# Patient Record
Sex: Female | Born: 1960 | Race: Asian | Hispanic: No | Marital: Married
Health system: Southern US, Community
[De-identification: ages and names within clinical notes are randomized; demographics above are authoritative.]

## PROBLEM LIST (undated history)

## (undated) DIAGNOSIS — A1889 Tuberculosis of other sites: Secondary | ICD-10-CM

## (undated) DIAGNOSIS — R509 Fever, unspecified: Secondary | ICD-10-CM

## (undated) DIAGNOSIS — A182 Tuberculous peripheral lymphadenopathy: Secondary | ICD-10-CM

## (undated) HISTORY — DX: Tuberculosis of other sites: A18.89

## (undated) HISTORY — DX: Tuberculous peripheral lymphadenopathy: A18.2

## (undated) HISTORY — DX: Fever, unspecified: R50.9

---

## 2012-07-15 DIAGNOSIS — N289 Disorder of kidney and ureter, unspecified: Secondary | ICD-10-CM | POA: Insufficient documentation

## 2012-07-15 DIAGNOSIS — J45909 Unspecified asthma, uncomplicated: Secondary | ICD-10-CM | POA: Insufficient documentation

## 2012-08-05 ENCOUNTER — Other Ambulatory Visit: Payer: Self-pay | Admitting: Family Medicine

## 2012-08-05 DIAGNOSIS — Z1231 Encounter for screening mammogram for malignant neoplasm of breast: Secondary | ICD-10-CM

## 2012-08-19 ENCOUNTER — Ambulatory Visit
Admission: RE | Admit: 2012-08-19 | Discharge: 2012-08-19 | Disposition: A | Discharge status: Home / Self Care | Payer: Commercial Indemnity | Source: Ambulatory Visit | Attending: Family Medicine | Admitting: Family Medicine

## 2012-08-19 DIAGNOSIS — Z1231 Encounter for screening mammogram for malignant neoplasm of breast: Secondary | ICD-10-CM

## 2013-08-05 ENCOUNTER — Other Ambulatory Visit: Payer: Self-pay

## 2013-08-23 ENCOUNTER — Other Ambulatory Visit: Payer: Self-pay

## 2013-08-23 DIAGNOSIS — Z1231 Encounter for screening mammogram for malignant neoplasm of breast: Secondary | ICD-10-CM

## 2013-09-13 ENCOUNTER — Ambulatory Visit
Admission: RE | Admit: 2013-09-13 | Discharge: 2013-09-13 | Disposition: A | Discharge status: Home / Self Care | Payer: Managed Care, Other (non HMO) | Source: Ambulatory Visit

## 2013-09-13 DIAGNOSIS — Z1231 Encounter for screening mammogram for malignant neoplasm of breast: Secondary | ICD-10-CM

## 2013-09-19 ENCOUNTER — Other Ambulatory Visit: Payer: Self-pay | Admitting: Family Medicine

## 2013-09-19 DIAGNOSIS — R928 Other abnormal and inconclusive findings on diagnostic imaging of breast: Secondary | ICD-10-CM

## 2013-10-10 ENCOUNTER — Ambulatory Visit
Admission: RE | Admit: 2013-10-10 | Discharge: 2013-10-10 | Disposition: A | Discharge status: Home / Self Care | Payer: Managed Care, Other (non HMO) | Source: Ambulatory Visit | Attending: Family Medicine | Admitting: Family Medicine

## 2013-10-10 ENCOUNTER — Other Ambulatory Visit: Payer: Self-pay | Admitting: Family Medicine

## 2013-10-10 DIAGNOSIS — R928 Other abnormal and inconclusive findings on diagnostic imaging of breast: Secondary | ICD-10-CM

## 2013-10-10 DIAGNOSIS — R921 Mammographic calcification found on diagnostic imaging of breast: Secondary | ICD-10-CM

## 2013-10-17 ENCOUNTER — Ambulatory Visit
Admission: RE | Admit: 2013-10-17 | Discharge: 2013-10-17 | Disposition: A | Discharge status: Home / Self Care | Payer: Managed Care, Other (non HMO) | Source: Ambulatory Visit | Attending: Family Medicine | Admitting: Family Medicine

## 2013-10-17 DIAGNOSIS — R921 Mammographic calcification found on diagnostic imaging of breast: Secondary | ICD-10-CM

## 2013-10-31 ENCOUNTER — Other Ambulatory Visit (HOSPITAL_COMMUNITY): Payer: Self-pay | Admitting: Family Medicine

## 2013-10-31 DIAGNOSIS — R51 Headache: Principal | ICD-10-CM

## 2013-10-31 DIAGNOSIS — R519 Headache, unspecified: Secondary | ICD-10-CM

## 2014-05-15 IMAGING — MG MM DIGITAL DIAGNOSTIC UNILAT*L*
2 series · 2 of 2 positions shown · non-contrast
Comparison: 09/13/2013, 08/19/2012.

CLINICAL DATA: Recall from screening mammography

EXAM:
DIGITAL DIAGNOSTIC  left breast MAMMOGRAM

[L CC]
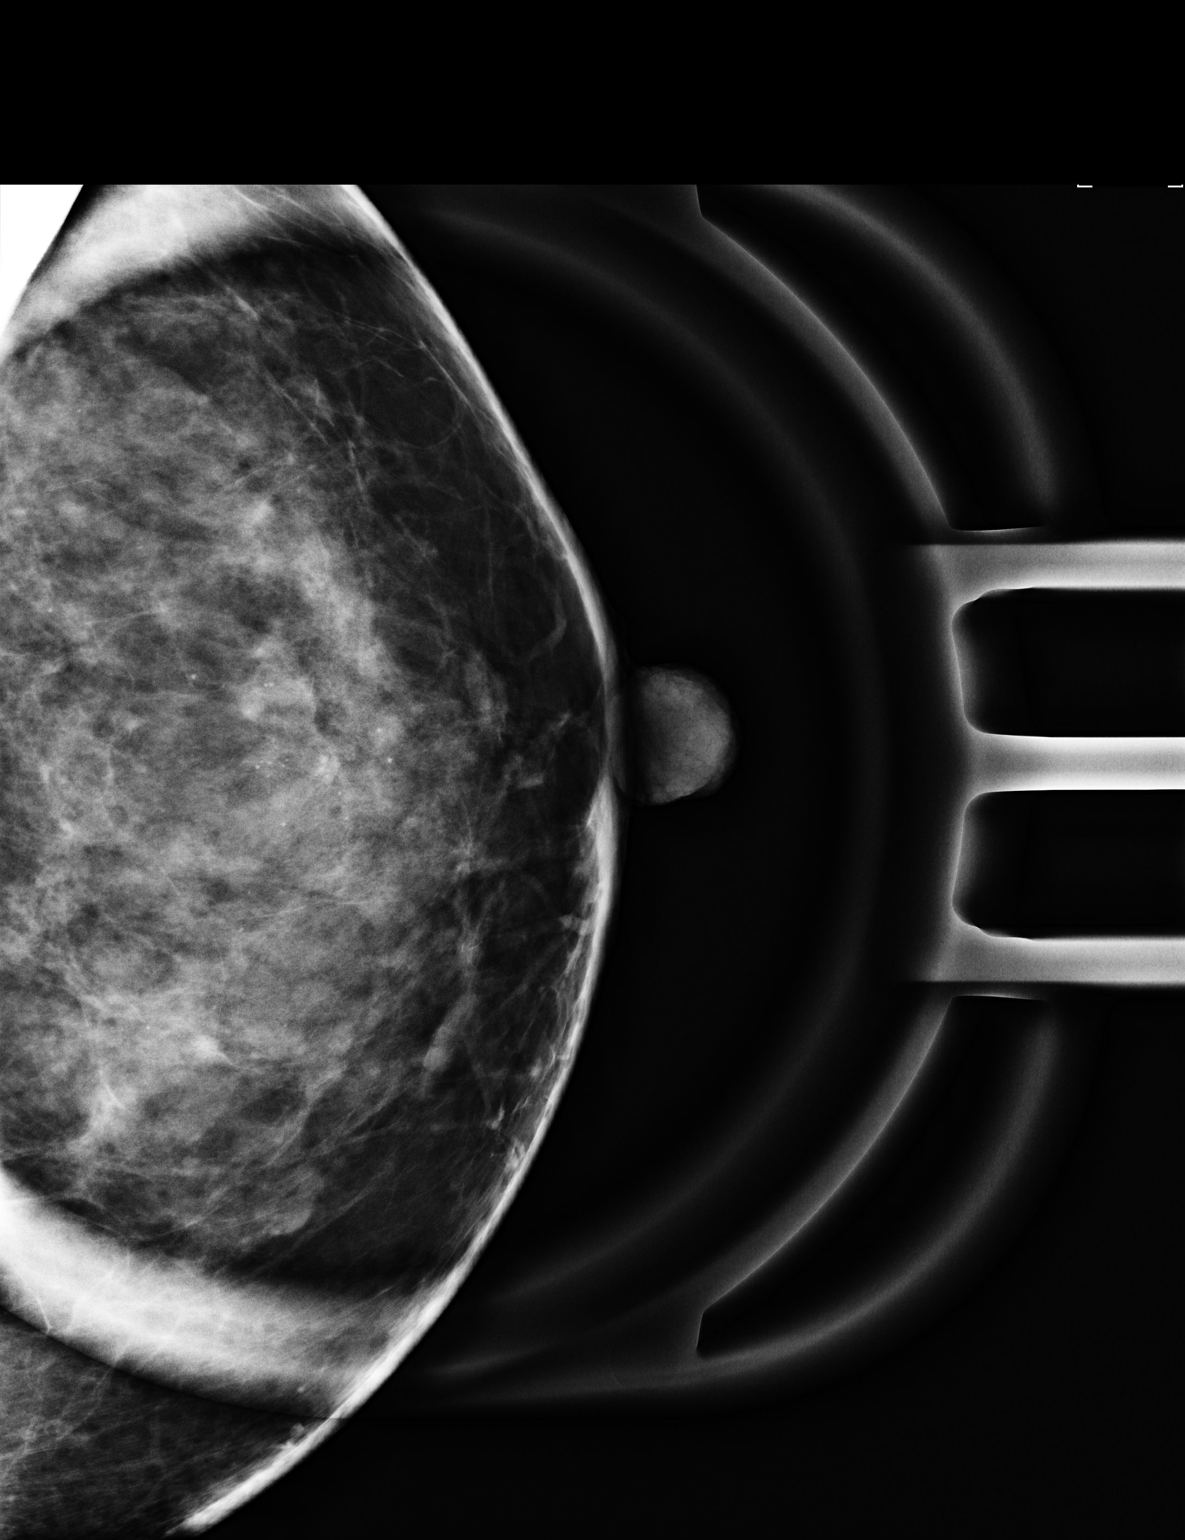

[L ML]
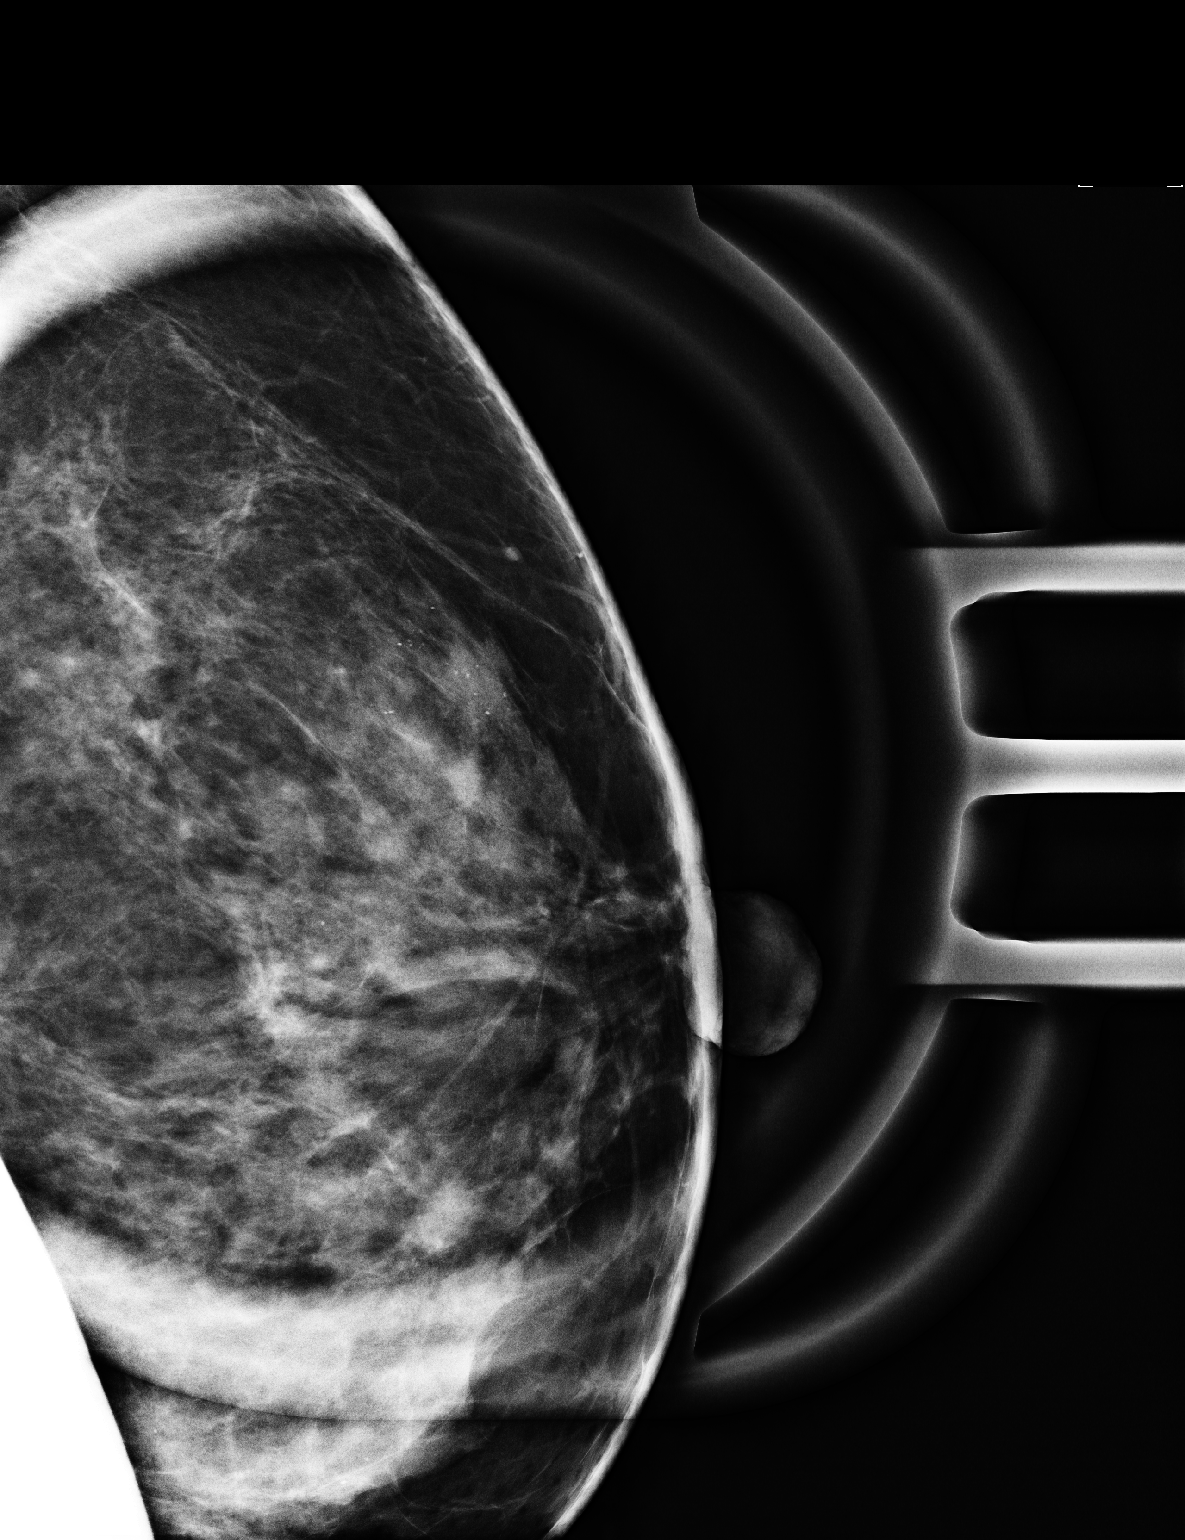

[2 of 2 positions shown; findings below may reference images not displayed]

ACR Breast Density Category c: The breasts are heterogeneously
dense, which may obscure small masses.
FINDINGS: Magnification views of the left breast demonstrate a group of
pleomorphic calcifications to be located superiorly within the left
breast at 12 o'clock position which span 1.4 cm. These are slightly
suspicious calcifications and tissue sampling is recommended. I have
discussed stereotactic core biopsy of the calcifications with the
patient utilizing her son as a translator. This will be scheduled
per patient preference.
IMPRESSION: Group of slightly suspicious calcifications located within the left
breast 12 o'clock position. Tissue sampling is recommended and
stereotactic core biopsy will be scheduled.

RECOMMENDATION:
Left breast stereotactic core biopsy.

I have discussed the findings and recommendations with the patient.
Results were also provided in writing at the conclusion of the
visit. If applicable, a reminder letter will be sent to the patient
regarding the next appointment.

BI-RADS CATEGORY  4: Suspicious abnormality - biopsy should be
considered.

## 2014-12-15 ENCOUNTER — Other Ambulatory Visit: Payer: Self-pay

## 2014-12-15 DIAGNOSIS — Z1231 Encounter for screening mammogram for malignant neoplasm of breast: Secondary | ICD-10-CM

## 2014-12-22 ENCOUNTER — Other Ambulatory Visit: Payer: Self-pay | Admitting: Nurse Practitioner

## 2014-12-25 ENCOUNTER — Ambulatory Visit
Admission: RE | Admit: 2014-12-25 | Discharge: 2014-12-25 | Disposition: A | Discharge status: Home / Self Care | Payer: BLUE CROSS/BLUE SHIELD | Source: Ambulatory Visit

## 2014-12-25 DIAGNOSIS — Z1231 Encounter for screening mammogram for malignant neoplasm of breast: Secondary | ICD-10-CM

## 2015-11-22 ENCOUNTER — Ambulatory Visit: Payer: BLUE CROSS/BLUE SHIELD | Admitting: Allergy and Immunology

## 2015-12-25 ENCOUNTER — Other Ambulatory Visit: Payer: Self-pay

## 2015-12-25 DIAGNOSIS — Z1231 Encounter for screening mammogram for malignant neoplasm of breast: Secondary | ICD-10-CM

## 2016-01-04 ENCOUNTER — Ambulatory Visit
Admission: RE | Admit: 2016-01-04 | Discharge: 2016-01-04 | Disposition: A | Discharge status: Home / Self Care | Payer: BLUE CROSS/BLUE SHIELD | Source: Ambulatory Visit

## 2016-01-04 DIAGNOSIS — Z1231 Encounter for screening mammogram for malignant neoplasm of breast: Secondary | ICD-10-CM

## 2016-12-29 ENCOUNTER — Other Ambulatory Visit: Payer: Self-pay | Admitting: Nurse Practitioner

## 2016-12-29 DIAGNOSIS — Z1231 Encounter for screening mammogram for malignant neoplasm of breast: Secondary | ICD-10-CM

## 2017-01-30 DIAGNOSIS — R590 Localized enlarged lymph nodes: Secondary | ICD-10-CM | POA: Insufficient documentation

## 2017-02-18 ENCOUNTER — Encounter: Payer: Self-pay | Admitting: Nurse Practitioner

## 2017-03-12 ENCOUNTER — Other Ambulatory Visit (HOSPITAL_COMMUNITY)
Admission: RE | Admit: 2017-03-12 | Discharge: 2017-03-12 | Disposition: A | Discharge status: Home / Self Care | Payer: BLUE CROSS/BLUE SHIELD | Source: Ambulatory Visit | Attending: Otolaryngology | Admitting: Otolaryngology

## 2017-03-12 ENCOUNTER — Other Ambulatory Visit: Payer: Self-pay | Admitting: Otolaryngology

## 2017-03-12 ENCOUNTER — Telehealth: Payer: Self-pay | Admitting: Hematology and Oncology

## 2017-03-12 ENCOUNTER — Encounter: Payer: Self-pay | Admitting: Hematology and Oncology

## 2017-03-12 DIAGNOSIS — I889 Nonspecific lymphadenitis, unspecified: Secondary | ICD-10-CM | POA: Diagnosis not present

## 2017-03-12 DIAGNOSIS — R591 Generalized enlarged lymph nodes: Secondary | ICD-10-CM | POA: Diagnosis present

## 2017-03-12 NOTE — Telephone Encounter (Signed)
Appt has been scheduled for the pt to see Dr. Bertis RuddyGorsuch on 5/23 at 1pm. Unable to reach the pt, but left a vm w/appt date and time. Letter mailed.

## 2017-03-13 DIAGNOSIS — I889 Nonspecific lymphadenitis, unspecified: Secondary | ICD-10-CM | POA: Insufficient documentation

## 2017-03-13 LAB — ACID FAST SMEAR (AFB): ACID FAST SMEAR - AFSCU2: NEGATIVE

## 2017-03-13 LAB — ACID FAST SMEAR (AFB, MYCOBACTERIA)

## 2017-03-17 LAB — AEROBIC/ANAEROBIC CULTURE W GRAM STAIN (SURGICAL/DEEP WOUND)

## 2017-03-17 LAB — AEROBIC/ANAEROBIC CULTURE (SURGICAL/DEEP WOUND): CULTURE: NO GROWTH

## 2017-03-18 ENCOUNTER — Telehealth: Payer: Self-pay | Admitting: Hematology and Oncology

## 2017-03-18 ENCOUNTER — Telehealth: Payer: Self-pay | Admitting: *Deleted

## 2017-03-18 ENCOUNTER — Ambulatory Visit: Payer: BLUE CROSS/BLUE SHIELD | Admitting: Hematology and Oncology

## 2017-03-18 NOTE — Telephone Encounter (Signed)
I discussed the case with Dr. Lazarus SalinesWolicki. The needle biopsy is nondiagnostic for cancer Appointment today has been cancelled Dr. Lazarus SalinesWolicki will send another referral if repeat biopsy shows cancer.

## 2017-03-18 NOTE — Telephone Encounter (Signed)
Notified husband. Appt today is cancelled.  Dr Gorsuch has sBertis Ruddypoken with Dr Cloria SpringWoliki. Waiting for more information regarding test results

## 2017-03-18 NOTE — Telephone Encounter (Signed)
Interpreter services has been cld to cancel for today.

## 2017-03-18 NOTE — Telephone Encounter (Signed)
Dr Daiva EvesVan Dam spoke with Dr Lazarus SalinesWolicki, would like to see this patient on Thursday 5/24 at 11:00.  Appointment scheduled. RN called patient using [employer] Raynelle Fanning(Julie, [phone]) who left message that Dr Lazarus SalinesWolicki would like her to follow up with Dr Daiva EvesVan Dam, left appointment time/address, phone number.   Andree CossHowell, Michelle M, RN

## 2017-03-19 ENCOUNTER — Encounter: Payer: Self-pay | Admitting: Infectious Disease

## 2017-03-19 ENCOUNTER — Ambulatory Visit
Admission: RE | Admit: 2017-03-19 | Discharge: 2017-03-19 | Disposition: A | Discharge status: Home / Self Care | Payer: BLUE CROSS/BLUE SHIELD | Source: Ambulatory Visit | Attending: Infectious Disease | Admitting: Infectious Disease

## 2017-03-19 ENCOUNTER — Ambulatory Visit (INDEPENDENT_AMBULATORY_CARE_PROVIDER_SITE_OTHER): Payer: BLUE CROSS/BLUE SHIELD | Admitting: Infectious Disease

## 2017-03-19 DIAGNOSIS — I889 Nonspecific lymphadenitis, unspecified: Secondary | ICD-10-CM

## 2017-03-19 DIAGNOSIS — R590 Localized enlarged lymph nodes: Secondary | ICD-10-CM | POA: Diagnosis not present

## 2017-03-19 DIAGNOSIS — A182 Tuberculous peripheral lymphadenopathy: Secondary | ICD-10-CM

## 2017-03-19 DIAGNOSIS — R509 Fever, unspecified: Secondary | ICD-10-CM | POA: Diagnosis not present

## 2017-03-19 DIAGNOSIS — A1889 Tuberculosis of other sites: Secondary | ICD-10-CM

## 2017-03-19 HISTORY — DX: Tuberculous peripheral lymphadenopathy: A18.2

## 2017-03-19 HISTORY — DX: Fever, unspecified: R50.9

## 2017-03-19 HISTORY — DX: Tuberculosis of other sites: A18.89

## 2017-03-19 LAB — CBC WITH DIFFERENTIAL/PLATELET
BASOS ABS: 0 {cells}/uL (ref 0–200)
Basophils Relative: 0 %
Eosinophils Absolute: 134 cells/uL (ref 15–500)
Eosinophils Relative: 2 %
HCT: 43.4 % (ref 35.0–45.0)
Hemoglobin: 14.3 g/dL (ref 11.7–15.5)
LYMPHS ABS: 2211 {cells}/uL (ref 850–3900)
Lymphocytes Relative: 33 %
MCH: 32.6 pg (ref 27.0–33.0)
MCHC: 32.9 g/dL (ref 32.0–36.0)
MCV: 98.9 fL (ref 80.0–100.0)
MPV: 10.7 fL (ref 7.5–12.5)
Monocytes Absolute: 469 cells/uL (ref 200–950)
Monocytes Relative: 7 %
NEUTROS ABS: 3886 {cells}/uL (ref 1500–7800)
Neutrophils Relative %: 58 %
PLATELETS: 369 10*3/uL (ref 140–400)
RBC: 4.39 MIL/uL (ref 3.80–5.10)
RDW: 15.5 % — ABNORMAL HIGH (ref 11.0–15.0)
WBC: 6.7 10*3/uL (ref 3.8–10.8)

## 2017-03-19 LAB — COMPLETE METABOLIC PANEL WITH GFR
ALT: 75 U/L — AB (ref 6–29)
AST: 68 U/L — ABNORMAL HIGH (ref 10–35)
Albumin: 4.2 g/dL (ref 3.6–5.1)
Alkaline Phosphatase: 94 U/L (ref 33–130)
BUN: 7 mg/dL (ref 7–25)
CHLORIDE: 105 mmol/L (ref 98–110)
CO2: 24 mmol/L (ref 20–31)
CREATININE: 0.54 mg/dL (ref 0.50–1.05)
Calcium: 9.1 mg/dL (ref 8.6–10.4)
Glucose, Bld: 85 mg/dL (ref 65–99)
Potassium: 4.2 mmol/L (ref 3.5–5.3)
Sodium: 139 mmol/L (ref 135–146)
Total Bilirubin: 0.3 mg/dL (ref 0.2–1.2)
Total Protein: 7.8 g/dL (ref 6.1–8.1)

## 2017-03-19 NOTE — Progress Notes (Signed)
Reason for Consult: Necrotizing Granulomatous lymphadenitis  Requesting Physician: Dr. Lazarus Salines  Subjective:    Patient ID: Jacqueline Weaver, female    DOB: [birth year], 56 y.o.   MRN: [id number]  HPI  Jacqueline Weaver is a 56 year old [place] lady who immigrated to [place] in 2013. She does not have much in the way of past medical history other than asthma, allergies. She is followed by Emmaline Life with [facility] healthcare in [facility]. She states that she has not traveled back to [place] since she immigrated here. She has taken a cruise with family to the [place] in 2015. She has not been exposed to anyone else who is been ill prior to the onset of her symptoms. Her husband has had a dry nonproductive cough which she believes is due to seasonal allergies that set in the spring.  She herself began noticing low-grade temperatures and malaise in December. She then developed painful cervical lymphadenopathy along with fevers body aches. She also states that she has been waking up at night due to the pain of her cervical lymphadenopathy also due to feeling hot and flushed and due to diaphoresis. She also endorses body aches at that time. She saw her primary care physician ultimately in March for these symptoms.  Of note from the primary care physician's notes it is noted that the patient did travel to [place] around Valentine's Day but that her symptoms had onset prior to traveling to [place]. She was not having any weight loss yet.  Primary care physician ordered HIV antigen antibody test which was negative and RPR which was negative sedimentation rate and C-reactive proteins, ANA and rheumatoid factor. All these were normal or negative.  CT of the neck was performed  Oon March 5, 20178 which showed:   Lymph nodes: Pathologic adenopathy right supraclavicular region with necrotic node mass measuring 16 x 22 mm. Additional pathologic nodes in the right peritracheal region. Right upper peritracheal node  is necrotic measuring 10 mm. Mid right tracheal lymph node is necrotic measuring 15 mm. Right lower peritracheal node necrotic measuring 9 Mm.  Chest x-ray which a bit on the first was negative.  She was prescribed azithromycin without much improvement in her cervical lymphadenopathy. She is still having chills and fever up to 100 102 at that time. Along the way she did have somewhat of a cough mentioned in the note so the patient currently denies ever having had a cough. Workup was underway including for malignancy and a CT scan was performed on April 2 which showed a few tiny scattered pulmonary nodules bilaterally in the lungs with the largest being in the lower lobes and largest one being 4 mm in dimension. There were no pulmonary infiltrates or pleural effusions. along with some mediastinal lymphadenopathy in particular the right paratracheal area. CT the abdomen and pelvis showed some small subcentimeter low-attenuation lesions in the lobes of the liver too small to characterize. Otherwise there issome significant findings and CT of the abdomen and pelvis.  Antrum she had undergone mammography and breast ultrasound which were negative for malignancy or for lymphadenopathy. Patient also in the interim and been given other rounds of clindamycin and azithromycin along with nonsteroidals without improvement in her cervical lymphadenopathy. She then underwent 2 fine-needle aspirates April 6 with pathology being nondiagnostic cultures were sent which were negative. She apparently had an adverse reaction with a rash after one of these lymph node aspirates. Of note she also had a prior history of having had  a diffuse body rash in 2016 that was treated with anti-allergy medications. Ultimately she had an excisional lymph node biopsy performed by Dr. Lazarus SalinesWolicki on 03/12/2017.  The biopsy showed significant necrotizing granulomatous inflammation throughout the lymphoid tissue. There was no evidence of malignancy  stains for AFB and fungal organisms were negative and the pathologist suggested the possibility of cat scratch disease. Patient did have material sent for AFB stain and culture fungal stain and culture as well as bacterial stain and culture.  Dr. 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   I worked the patient and to clinic today and we examined her. I initially thought that she had had recent imaging that showed no pulmonary findings but when I found out that imaging was a March asked her where mass when she first came to clinic. We have simply obtain a chest x-ray which shows no evidence of parenchymal or pleural disease and I have allowed her to not wear her mask any further.  In talking with the patient she is actually allergic to cats and has no history of exposure to cats or kittens for years. She does not know the specific contact for tuberculosis.     Past Medical History:  Diagnosis Date  . 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 FUO (fever of unknown origin) 03/19/2017  . Scrofula 03/19/2017    No past surgical history on file.  No family history on file.    Social History   Social History  . Marital status: Married    Spouse name: N/A  . Number of children: N/A  . Years of education: N/A   Social History Main Topics  . Smoking status: Not on file  . Smokeless tobacco: Not on file  . Alcohol use Not on file  . Drug use: Unknown  . Sexual activity: Not on file   Other Topics Concern  . Not on file   Social History Narrative  . No narrative on file    Allergies  Allergen Reactions  . Diclofenac Hives    No current outpatient prescriptions on file.    Review of Systems  Constitutional: Positive for diaphoresis and fatigue. Negative for activity change, appetite change, chills, fever and unexpected weight change.  HENT: Negative for congestion, rhinorrhea,  sinus pressure, sneezing, sore throat and trouble swallowing.   Eyes: Negative for photophobia and visual disturbance.  Respiratory: Negative for cough, chest tightness, shortness of breath, wheezing and stridor.   Cardiovascular: Negative for chest pain, palpitations and leg swelling.  Gastrointestinal: Negative for abdominal distention, abdominal pain, anal bleeding, blood in stool, constipation, diarrhea, nausea and vomiting.  Genitourinary: Negative for difficulty urinating, dysuria, flank pain and hematuria.  Musculoskeletal: Positive for neck pain. Negative for arthralgias, back pain, gait problem, joint swelling and myalgias.  Skin: Positive for wound. Negative for color change, pallor and rash.  Allergic/Immunologic: Positive for environmental allergies.  Neurological: Negative for dizziness, tremors, weakness and light-headedness.  Hematological: Positive for adenopathy. Does not bruise/bleed easily.  Psychiatric/Behavioral: Negative for agitation, behavioral problems, confusion, decreased concentration, dysphoric mood and sleep disturbance.       Objective:   Physical Exam  Constitutional: She is oriented to person, place, and time. She appears well-developed and well-nourished. No distress.  HENT:  Head: Normocephalic and atraumatic.  Mouth/Throat: No oropharyngeal exudate.  Eyes: Conjunctivae and EOM are normal. No scleral icterus.  Neck: Normal range of motion. Neck supple. No tracheal  deviation present. No thyromegaly present.  Cardiovascular: Normal rate, regular rhythm and normal heart sounds.  Exam reveals no gallop and no friction rub.   No murmur heard. Pulmonary/Chest: Effort normal and breath sounds normal. No respiratory distress. She has no wheezes. She has no rales. She exhibits no tenderness.  Abdominal: Soft. Bowel sounds are normal. She exhibits no distension. There is no tenderness. There is no rebound and no guarding.  Musculoskeletal: She exhibits no edema or  tenderness.  Lymphadenopathy:       Head (right side): Submandibular and preauricular adenopathy present.       Head (left side): Submandibular and preauricular adenopathy present.    She has cervical adenopathy.       Right cervical: Superficial cervical and deep cervical adenopathy present.    She has no axillary adenopathy.       Right axillary: No pectoral adenopathy present.       Left axillary: No pectoral adenopathy present.      Right: Supraclavicular adenopathy present. No epitrochlear adenopathy present.       Left: Supraclavicular adenopathy present. No epitrochlear adenopathy present.  Right >> left LA  Neurological: She is alert and oriented to person, place, and time. She exhibits normal muscle tone. Coordination normal.  Skin: Skin is warm and dry. No rash noted. She is not diaphoretic. No erythema. No pallor.  Psychiatric: She has a normal mood and affect. Her behavior is normal. Judgment and thought content normal.    She has an area bandage where her lymph node was biopsied and is going to see Dr. Lazarus Salines today      Assessment & Plan:    Necrotizing granulomatous cervical lymphadenitis:  In a patient who is a native of [place] originally tuberculosis arises to the top of the differential. Other possible causes could include nontuberculous mycobacteria, dimorphic fungi, possibly Burkholderia pseudomallei, Cat scratch disease with correct epidemiological risk factors, autoimmune, sarcoid and [place]is's  I spent greater than 40 minutes with the patient including greater than 50% of time in face to face counsel of the patient and in coordination of their care.   We will send a Quantiferon  today along with a repeat CBC and compress metabolic panel sedimentation rate C-reactive protein. I'll send an LDH as well and an angiotensin-converting enzyme level  She has already had HIV test negative  I will ask microbiology lab to send for an MTB PCR probe from the  culture.  I last the pathology lab to send tissue from her necrotizing lymph node to the nurse to wash in [facility] for PCR probe for Mycobacterium  tuberculosis as well as other mycobacteria fungi and bacteria.  I have contacted the health department and will fax them my note. They'll be reaching out of the patient to start directly observed therapy for extrapulmonary TB with lymph node involvement tomorrow.  I'll arrange follow-up for her to be seen in her clinic in the next 6 weeks.  I spent greater than 80  minutes with the patient including greater than 50% of time in face to face counsel of the patient and her sone with son and my [place] pharmacist providing translation (patient signed permission for them to translate) Herbie Baltimore workup of cervical lymphadenitis likelihood of tuberculosis, review of her outside records and labs and personal review and interpretation of her chest x-ray as well as in in coordination of her care with years nose and throat as well as the [facility] health Department.  I've spoken with  Legrand Rams and Nedra Hai in the [facility] health Department will fax records to [phone]

## 2017-03-20 LAB — ANGIOTENSIN CONVERTING ENZYME: Angiotensin-Converting Enzyme: 48 U/L (ref 9–67)

## 2017-03-20 LAB — SEDIMENTATION RATE: SED RATE: 42 mm/h — AB (ref 0–30)

## 2017-03-20 LAB — HEPATITIS B SURFACE ANTIGEN: Hepatitis B Surface Ag: NEGATIVE

## 2017-03-20 LAB — ANA: ANA: NEGATIVE

## 2017-03-20 LAB — LACTATE DEHYDROGENASE: LDH: 198 U/L (ref 120–250)

## 2017-03-20 LAB — HEPATITIS B SURFACE ANTIBODY,QUALITATIVE: Hep B S Ab: NEGATIVE

## 2017-03-20 LAB — C-REACTIVE PROTEIN: CRP: 6.9 mg/L (ref <8.0)

## 2017-03-24 LAB — QUANTIFERON TB GOLD ASSAY (BLOOD)
Interferon Gamma Release Assay: POSITIVE — AB
Mitogen-Nil: 8.06 IU/mL
QUANTIFERON NIL VALUE: 0.34 [IU]/mL
Quantiferon Tb Ag Minus Nil Value: 9.34 IU/mL

## 2017-04-01 LAB — MTB NAA WITHOUT AFB CULTURE

## 2017-04-10 LAB — FUNGUS CULTURE WITH STAIN

## 2017-04-10 LAB — FUNGAL ORGANISM REFLEX

## 2017-04-10 LAB — FUNGUS CULTURE RESULT

## 2017-05-12 LAB — AFB ORGANISM ID BY DNA PROBE
M AVIUM COMPLEX: NEGATIVE
M tuberculosis complex: POSITIVE — AB

## 2017-05-12 LAB — MTB SUSCEPTIBILITY BROTH, REFLEXED

## 2017-05-12 LAB — ACID FAST CULTURE WITH REFLEXED SENSITIVITIES (MYCOBACTERIA): Acid Fast Culture: POSITIVE — AB

## 2017-09-23 ENCOUNTER — Other Ambulatory Visit: Payer: Self-pay | Admitting: Internal Medicine

## 2017-09-23 ENCOUNTER — Ambulatory Visit
Admission: RE | Admit: 2017-09-23 | Discharge: 2017-09-23 | Disposition: A | Discharge status: Home / Self Care | Payer: No Typology Code available for payment source | Source: Ambulatory Visit | Attending: Internal Medicine | Admitting: Internal Medicine

## 2017-09-23 DIAGNOSIS — Z09 Encounter for follow-up examination after completed treatment for conditions other than malignant neoplasm: Secondary | ICD-10-CM

## 2021-08-05 ENCOUNTER — Other Ambulatory Visit: Payer: Self-pay | Admitting: Nurse Practitioner

## 2021-08-05 DIAGNOSIS — Z1231 Encounter for screening mammogram for malignant neoplasm of breast: Secondary | ICD-10-CM

## 2021-08-22 ENCOUNTER — Other Ambulatory Visit: Payer: Self-pay | Admitting: Nurse Practitioner

## 2021-08-22 DIAGNOSIS — R1011 Right upper quadrant pain: Secondary | ICD-10-CM

## 2021-09-04 ENCOUNTER — Other Ambulatory Visit: Payer: Self-pay

## 2021-09-04 ENCOUNTER — Ambulatory Visit
Admission: RE | Admit: 2021-09-04 | Discharge: 2021-09-04 | Disposition: A | Discharge status: Home / Self Care | Payer: 59 | Source: Ambulatory Visit | Attending: Nurse Practitioner | Admitting: Nurse Practitioner

## 2021-09-04 DIAGNOSIS — Z1231 Encounter for screening mammogram for malignant neoplasm of breast: Secondary | ICD-10-CM

## 2021-09-05 ENCOUNTER — Ambulatory Visit
Admission: RE | Admit: 2021-09-05 | Discharge: 2021-09-05 | Disposition: A | Discharge status: Home / Self Care | Payer: 59 | Source: Ambulatory Visit | Attending: Nurse Practitioner | Admitting: Nurse Practitioner

## 2021-09-05 DIAGNOSIS — R1011 Right upper quadrant pain: Secondary | ICD-10-CM

## 2022-04-10 IMAGING — US US ABDOMEN LIMITED
1 series · 14 of 25 positions shown · non-contrast
Comparison: None.

CLINICAL DATA: Right upper quadrant pain for 2 years

EXAM:
ULTRASOUND ABDOMEN LIMITED RIGHT UPPER QUADRANT

[Series 1: us abdomen limited · 0.15mm/px · 14 of 50 slices shown]
[im 1/50]
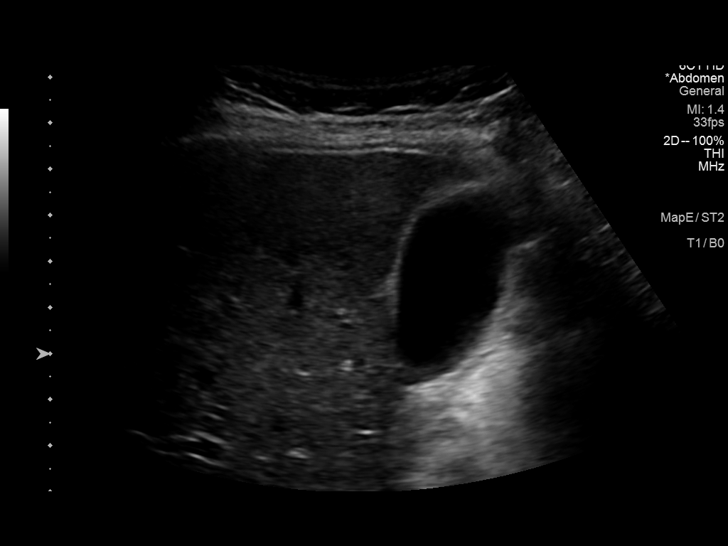
[im 5/50]
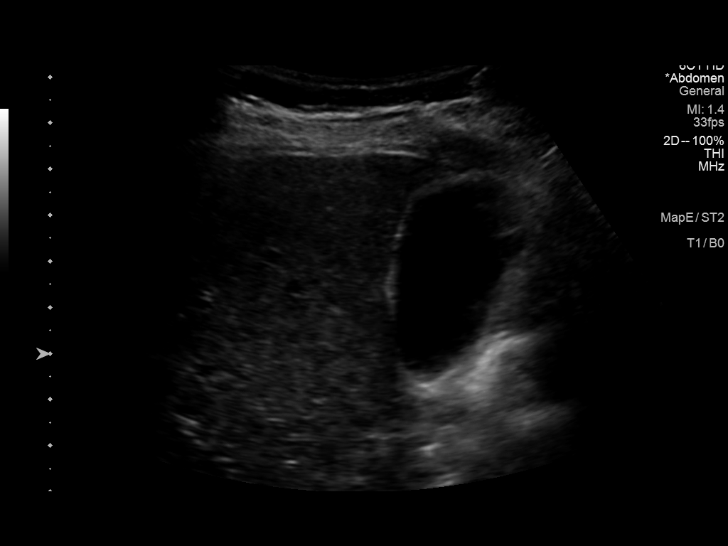
[im 9/50]
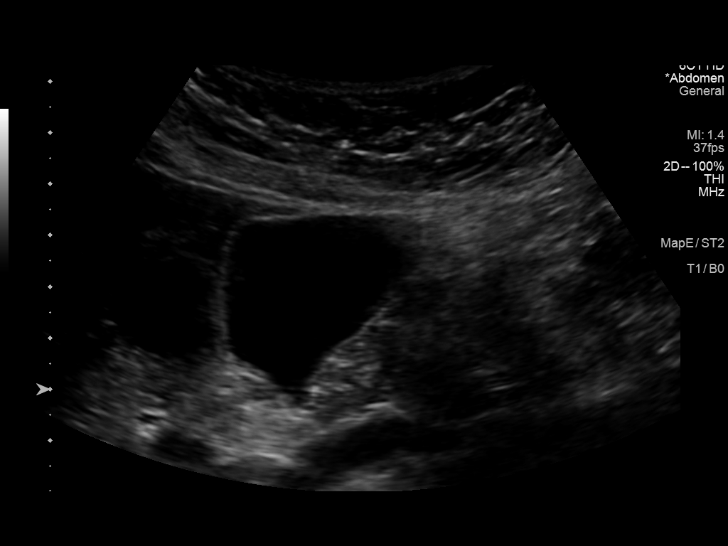
[im 13/50]
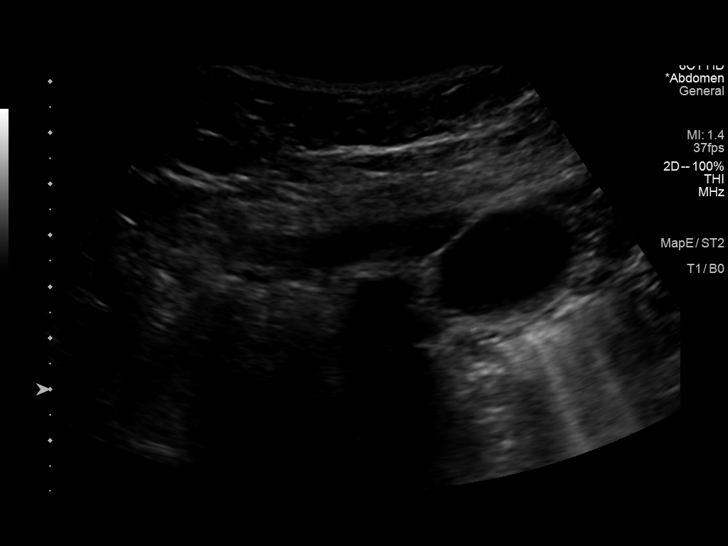
[im 17/50]
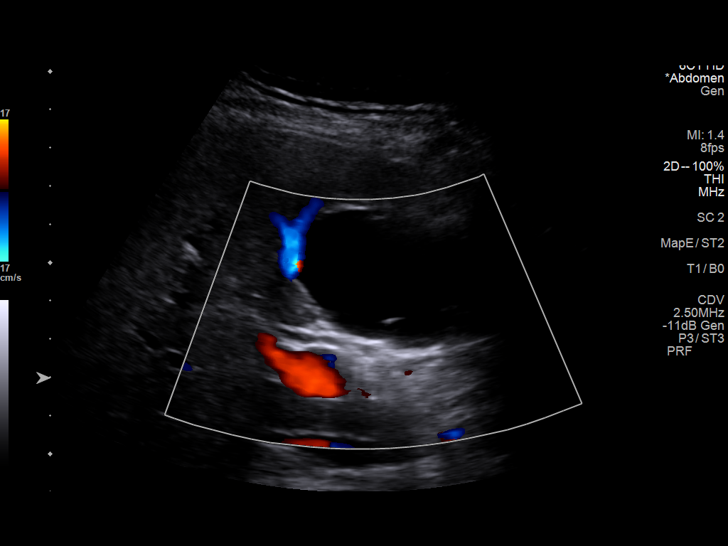
[im 19/50]
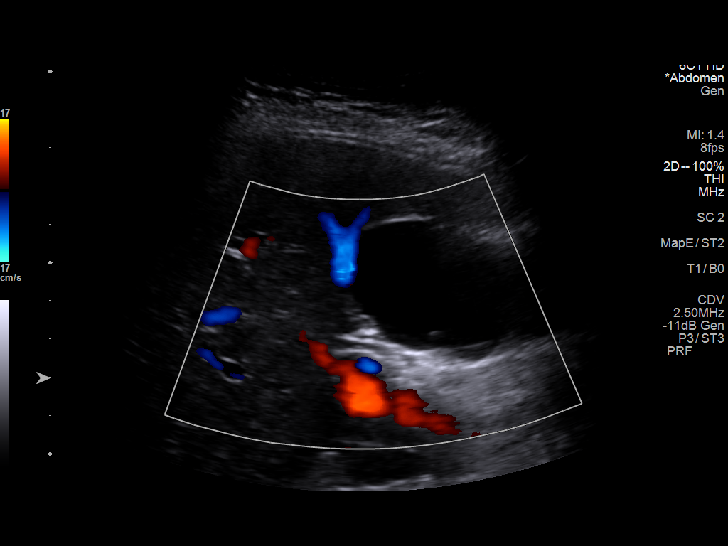
[im 23/50]
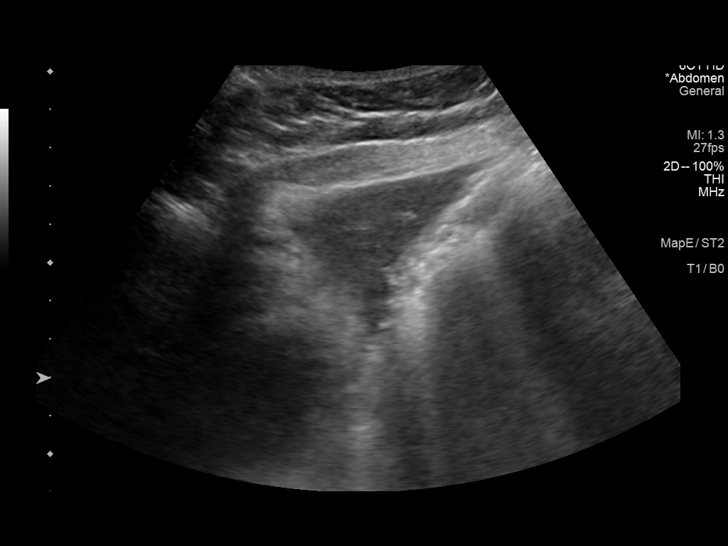
[im 27/50]
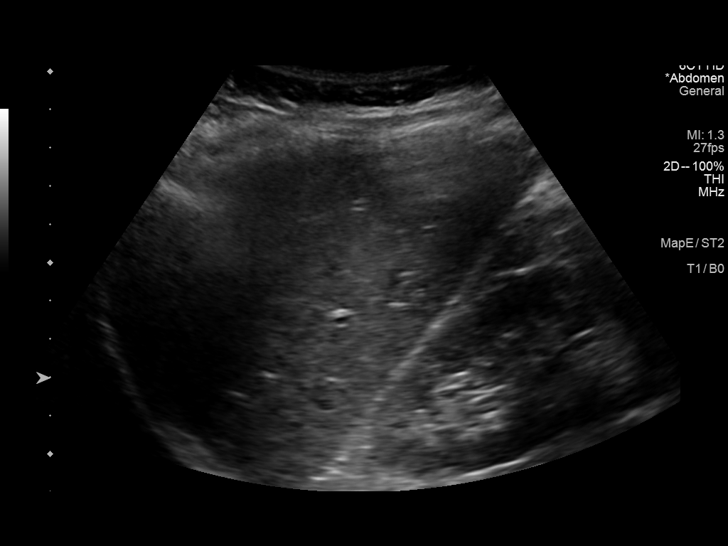
[im 31/50]
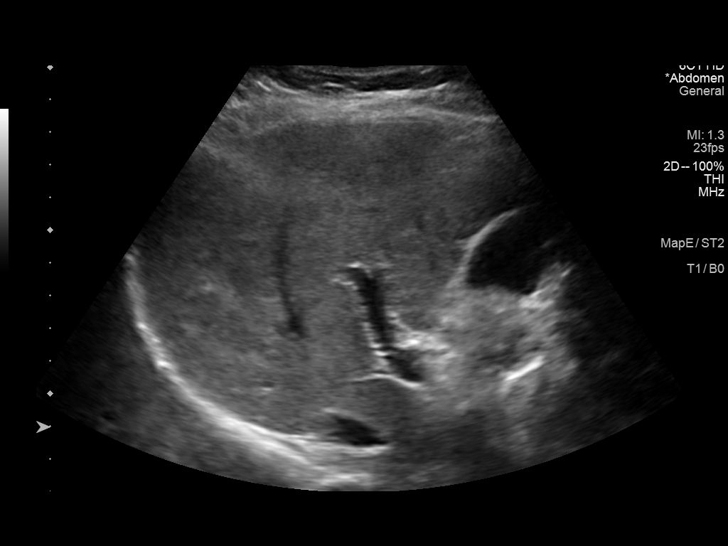
[im 33/50]
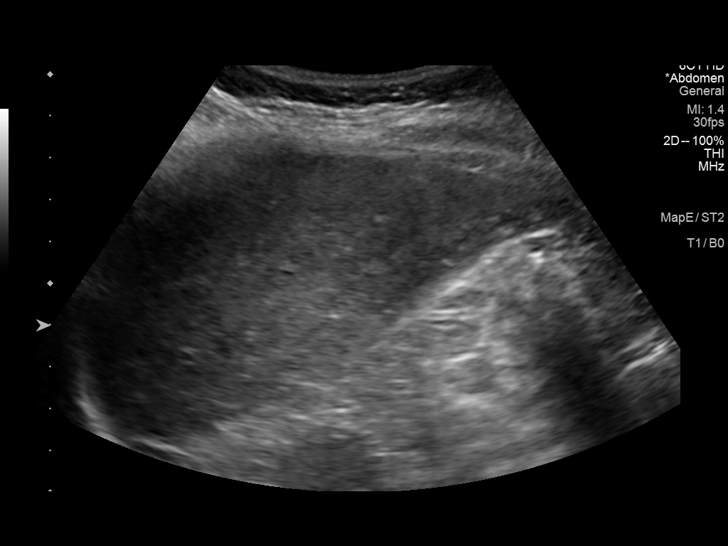
[im 37/50]
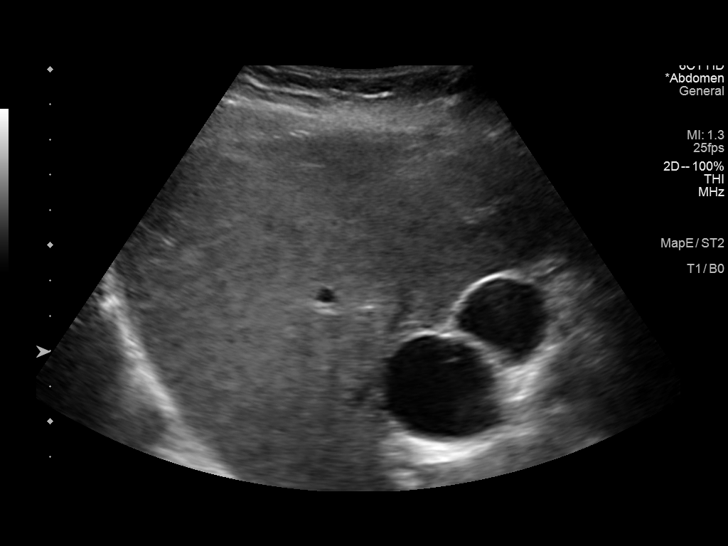
[im 41/50]
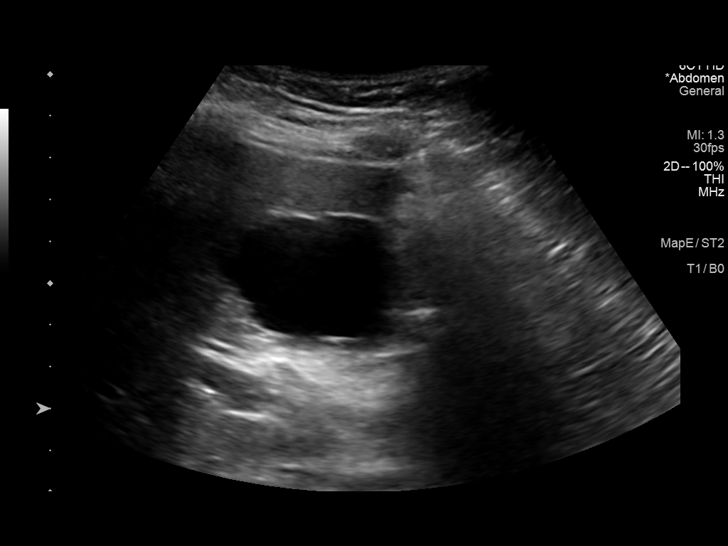
[im 45/50]
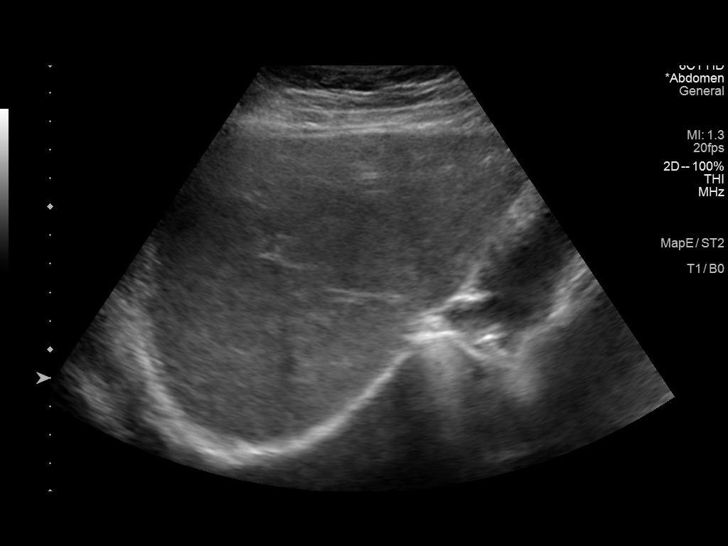
[im 50/50]
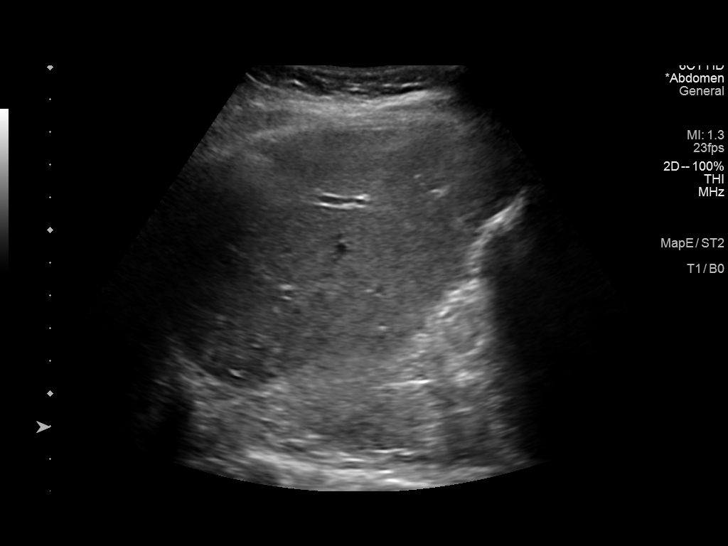

[14 of 25 positions shown; findings below may reference images not displayed]

FINDINGS: Gallbladder:

No gallstones or wall thickening visualized. No sonographic Murphy
sign noted by sonographer.

Common bile duct:

Diameter: 3 mm

Liver:

Heterogeneous increased liver echotexture compatible with hepatic
steatosis. Simple appearing 3.1 x 3.6 x 3.4 cm cyst within the right
lobe liver. No other liver abnormalities. No intrahepatic duct
dilation. Portal vein is patent on color Doppler imaging with normal
direction of blood flow towards the liver.

Other: None.
IMPRESSION: 1. Mild heterogeneous increased liver echotexture consistent with
hepatic steatosis.
2. Simple appearing right lobe liver cyst.
3. Otherwise unremarkable exam.

## 2022-08-05 ENCOUNTER — Telehealth: Payer: PRIVATE HEALTH INSURANCE

## 2022-08-05 NOTE — Telephone Encounter
Called and spoke with patient's daughter regarding scheduling new patient appointment (since daughter will be bringing patient to appointment).  Received referral from Lisa Bolivia, NP regarding diagnosis of Thyroid nodule.  Patient's daughter states she will need to call back later to schedule since she currently does not know what days she will be available to bring her mother.  Supplied patient with phone number to office to call back to schedule.  Please schedule patient for next available new patient appointment with Dr. Shaaron Adler (ENDO).  Per patient daughter patient has not seen Endocrinologist before.

## 2022-08-14 ENCOUNTER — Other Ambulatory Visit: Payer: Self-pay | Admitting: Nurse Practitioner

## 2022-08-14 DIAGNOSIS — Z1231 Encounter for screening mammogram for malignant neoplasm of breast: Secondary | ICD-10-CM

## 2022-09-01 ENCOUNTER — Other Ambulatory Visit: Payer: Self-pay | Admitting: Nurse Practitioner

## 2022-09-01 DIAGNOSIS — K760 Fatty (change of) liver, not elsewhere classified: Secondary | ICD-10-CM

## 2022-09-10 ENCOUNTER — Ambulatory Visit
Admission: RE | Admit: 2022-09-10 | Discharge: 2022-09-10 | Disposition: A | Discharge status: Home / Self Care | Payer: 59 | Source: Ambulatory Visit | Attending: Nurse Practitioner | Admitting: Nurse Practitioner

## 2022-09-10 DIAGNOSIS — K760 Fatty (change of) liver, not elsewhere classified: Secondary | ICD-10-CM

## 2022-10-03 ENCOUNTER — Ambulatory Visit: Payer: 59

## 2022-11-19 ENCOUNTER — Ambulatory Visit
Admission: RE | Admit: 2022-11-19 | Discharge: 2022-11-19 | Disposition: A | Discharge status: Home / Self Care | Payer: No Typology Code available for payment source | Source: Ambulatory Visit | Attending: Nurse Practitioner | Admitting: Nurse Practitioner

## 2022-11-19 DIAGNOSIS — Z1231 Encounter for screening mammogram for malignant neoplasm of breast: Secondary | ICD-10-CM

## 2023-01-29 ENCOUNTER — Ambulatory Visit: Payer: PRIVATE HEALTH INSURANCE

## 2023-12-07 ENCOUNTER — Other Ambulatory Visit: Payer: Self-pay | Admitting: Nurse Practitioner

## 2023-12-07 DIAGNOSIS — Z1231 Encounter for screening mammogram for malignant neoplasm of breast: Secondary | ICD-10-CM

## 2023-12-11 ENCOUNTER — Ambulatory Visit
Admission: RE | Admit: 2023-12-11 | Discharge: 2023-12-11 | Disposition: A | Discharge status: Home / Self Care | Payer: BC Managed Care – PPO | Source: Ambulatory Visit | Attending: Nurse Practitioner | Admitting: Nurse Practitioner

## 2023-12-11 DIAGNOSIS — Z1231 Encounter for screening mammogram for malignant neoplasm of breast: Secondary | ICD-10-CM
